# Patient Record
Sex: Female | Born: 1950 | Race: White | Hispanic: No | Marital: Married
Health system: Southern US, Community
[De-identification: ages and names within clinical notes are randomized; demographics above are authoritative.]

---

## 2002-02-01 ENCOUNTER — Other Ambulatory Visit: Admission: RE | Admit: 2002-02-01 | Discharge: 2002-02-01 | Payer: Self-pay | Admitting: Obstetrics and Gynecology

## 2002-02-20 ENCOUNTER — Encounter: Payer: Self-pay | Admitting: Obstetrics and Gynecology

## 2002-02-20 ENCOUNTER — Ambulatory Visit (HOSPITAL_COMMUNITY): Admission: RE | Admit: 2002-02-20 | Discharge: 2002-02-20 | Payer: Self-pay | Admitting: Obstetrics and Gynecology

## 2003-02-22 ENCOUNTER — Ambulatory Visit (HOSPITAL_COMMUNITY): Admission: RE | Admit: 2003-02-22 | Discharge: 2003-02-22 | Payer: Self-pay | Admitting: Family Medicine

## 2003-03-13 ENCOUNTER — Encounter: Admission: RE | Admit: 2003-03-13 | Discharge: 2003-03-13 | Payer: Self-pay | Admitting: Family Medicine

## 2004-02-28 ENCOUNTER — Ambulatory Visit (HOSPITAL_COMMUNITY): Admission: RE | Admit: 2004-02-28 | Discharge: 2004-02-28 | Payer: Self-pay | Admitting: Family Medicine

## 2005-03-15 ENCOUNTER — Ambulatory Visit (HOSPITAL_COMMUNITY): Admission: RE | Admit: 2005-03-15 | Discharge: 2005-03-15 | Payer: Self-pay | Admitting: Family Medicine

## 2006-03-24 ENCOUNTER — Ambulatory Visit (HOSPITAL_COMMUNITY): Admission: RE | Admit: 2006-03-24 | Discharge: 2006-03-24 | Payer: Self-pay | Admitting: Family Medicine

## 2006-04-08 ENCOUNTER — Encounter: Admission: RE | Admit: 2006-04-08 | Discharge: 2006-04-08 | Payer: Self-pay | Admitting: Family Medicine

## 2007-04-19 ENCOUNTER — Ambulatory Visit (HOSPITAL_COMMUNITY): Admission: RE | Admit: 2007-04-19 | Discharge: 2007-04-19 | Payer: Self-pay | Admitting: Family Medicine

## 2008-04-19 ENCOUNTER — Ambulatory Visit (HOSPITAL_COMMUNITY): Admission: RE | Admit: 2008-04-19 | Discharge: 2008-04-19 | Payer: Self-pay | Admitting: Family Medicine

## 2008-04-22 ENCOUNTER — Ambulatory Visit (HOSPITAL_COMMUNITY): Admission: RE | Admit: 2008-04-22 | Discharge: 2008-04-22 | Payer: Self-pay | Admitting: Family Medicine

## 2009-04-21 ENCOUNTER — Ambulatory Visit (HOSPITAL_COMMUNITY): Admission: RE | Admit: 2009-04-21 | Discharge: 2009-04-21 | Payer: Self-pay | Admitting: Family Medicine

## 2009-04-21 DIAGNOSIS — J309 Allergic rhinitis, unspecified: Secondary | ICD-10-CM | POA: Insufficient documentation

## 2010-03-08 ENCOUNTER — Encounter: Payer: Self-pay | Admitting: Neurosurgery

## 2010-03-08 ENCOUNTER — Encounter: Payer: Self-pay | Admitting: Family Medicine

## 2010-03-26 ENCOUNTER — Other Ambulatory Visit (HOSPITAL_COMMUNITY): Payer: Self-pay | Admitting: Family Medicine

## 2010-03-26 DIAGNOSIS — Z1231 Encounter for screening mammogram for malignant neoplasm of breast: Secondary | ICD-10-CM

## 2010-04-29 ENCOUNTER — Other Ambulatory Visit (HOSPITAL_COMMUNITY): Payer: Self-pay | Admitting: Family Medicine

## 2010-04-29 ENCOUNTER — Ambulatory Visit (HOSPITAL_COMMUNITY)
Admission: RE | Admit: 2010-04-29 | Discharge: 2010-04-29 | Disposition: A | Discharge status: Home / Self Care | Payer: 59 | Source: Ambulatory Visit | Attending: Family Medicine | Admitting: Family Medicine

## 2010-04-29 DIAGNOSIS — Z1231 Encounter for screening mammogram for malignant neoplasm of breast: Secondary | ICD-10-CM | POA: Insufficient documentation

## 2010-04-29 DIAGNOSIS — Q782 Osteopetrosis: Secondary | ICD-10-CM

## 2010-04-30 DIAGNOSIS — M8580 Other specified disorders of bone density and structure, unspecified site: Secondary | ICD-10-CM | POA: Insufficient documentation

## 2010-05-04 ENCOUNTER — Ambulatory Visit (HOSPITAL_COMMUNITY): Admission: RE | Admit: 2010-05-04 | Payer: 59 | Source: Ambulatory Visit

## 2010-05-05 ENCOUNTER — Ambulatory Visit (HOSPITAL_COMMUNITY)
Admission: RE | Admit: 2010-05-05 | Discharge: 2010-05-05 | Disposition: A | Discharge status: Home / Self Care | Payer: 59 | Source: Ambulatory Visit | Attending: Family Medicine | Admitting: Family Medicine

## 2010-05-05 DIAGNOSIS — Z13820 Encounter for screening for osteoporosis: Secondary | ICD-10-CM | POA: Insufficient documentation

## 2010-05-05 DIAGNOSIS — Q782 Osteopetrosis: Secondary | ICD-10-CM

## 2010-05-05 DIAGNOSIS — Z780 Asymptomatic menopausal state: Secondary | ICD-10-CM | POA: Insufficient documentation

## 2010-05-05 DIAGNOSIS — E559 Vitamin D deficiency, unspecified: Secondary | ICD-10-CM | POA: Insufficient documentation

## 2010-06-04 ENCOUNTER — Ambulatory Visit (HOSPITAL_COMMUNITY): Payer: 59

## 2011-03-30 ENCOUNTER — Other Ambulatory Visit (HOSPITAL_COMMUNITY): Payer: Self-pay | Admitting: Family Medicine

## 2011-03-30 DIAGNOSIS — Z1231 Encounter for screening mammogram for malignant neoplasm of breast: Secondary | ICD-10-CM

## 2011-05-03 ENCOUNTER — Ambulatory Visit (HOSPITAL_COMMUNITY)
Admission: RE | Admit: 2011-05-03 | Discharge: 2011-05-03 | Disposition: A | Discharge status: Home / Self Care | Payer: 59 | Source: Ambulatory Visit | Attending: Family Medicine | Admitting: Family Medicine

## 2011-05-03 DIAGNOSIS — Z1231 Encounter for screening mammogram for malignant neoplasm of breast: Secondary | ICD-10-CM | POA: Insufficient documentation

## 2011-05-27 DIAGNOSIS — G4700 Insomnia, unspecified: Secondary | ICD-10-CM | POA: Insufficient documentation

## 2011-10-22 DIAGNOSIS — K219 Gastro-esophageal reflux disease without esophagitis: Secondary | ICD-10-CM | POA: Insufficient documentation

## 2012-01-21 DIAGNOSIS — F329 Major depressive disorder, single episode, unspecified: Secondary | ICD-10-CM | POA: Insufficient documentation

## 2012-03-03 DIAGNOSIS — F4321 Adjustment disorder with depressed mood: Secondary | ICD-10-CM | POA: Insufficient documentation

## 2012-04-04 ENCOUNTER — Other Ambulatory Visit (HOSPITAL_COMMUNITY): Payer: Self-pay | Admitting: Family Medicine

## 2012-04-04 DIAGNOSIS — Z1231 Encounter for screening mammogram for malignant neoplasm of breast: Secondary | ICD-10-CM

## 2012-05-11 ENCOUNTER — Ambulatory Visit (HOSPITAL_COMMUNITY)
Admission: RE | Admit: 2012-05-11 | Discharge: 2012-05-11 | Disposition: A | Discharge status: Home / Self Care | Payer: BC Managed Care – PPO | Source: Ambulatory Visit | Attending: Family Medicine | Admitting: Family Medicine

## 2012-05-11 DIAGNOSIS — Z1231 Encounter for screening mammogram for malignant neoplasm of breast: Secondary | ICD-10-CM | POA: Insufficient documentation

## 2013-04-09 ENCOUNTER — Other Ambulatory Visit (HOSPITAL_COMMUNITY): Payer: Self-pay | Admitting: Family Medicine

## 2013-04-09 DIAGNOSIS — Z1231 Encounter for screening mammogram for malignant neoplasm of breast: Secondary | ICD-10-CM

## 2013-05-17 ENCOUNTER — Ambulatory Visit (HOSPITAL_COMMUNITY)
Admission: RE | Admit: 2013-05-17 | Discharge: 2013-05-17 | Disposition: A | Discharge status: Home / Self Care | Payer: BC Managed Care – PPO | Source: Ambulatory Visit | Attending: Family Medicine | Admitting: Family Medicine

## 2013-05-17 DIAGNOSIS — Z1231 Encounter for screening mammogram for malignant neoplasm of breast: Secondary | ICD-10-CM

## 2013-12-05 ENCOUNTER — Other Ambulatory Visit (HOSPITAL_COMMUNITY): Payer: Self-pay | Admitting: Family Medicine

## 2013-12-05 DIAGNOSIS — M810 Age-related osteoporosis without current pathological fracture: Secondary | ICD-10-CM

## 2013-12-06 ENCOUNTER — Ambulatory Visit (HOSPITAL_COMMUNITY)
Admission: RE | Admit: 2013-12-06 | Discharge: 2013-12-06 | Disposition: A | Discharge status: Home / Self Care | Payer: BC Managed Care – PPO | Source: Ambulatory Visit | Attending: Family Medicine | Admitting: Family Medicine

## 2013-12-06 DIAGNOSIS — Z780 Asymptomatic menopausal state: Secondary | ICD-10-CM | POA: Diagnosis not present

## 2013-12-06 DIAGNOSIS — Z13820 Encounter for screening for osteoporosis: Secondary | ICD-10-CM | POA: Insufficient documentation

## 2013-12-06 DIAGNOSIS — M810 Age-related osteoporosis without current pathological fracture: Secondary | ICD-10-CM

## 2014-05-07 ENCOUNTER — Other Ambulatory Visit (HOSPITAL_COMMUNITY): Payer: Self-pay | Admitting: Family Medicine

## 2014-05-07 DIAGNOSIS — Z1231 Encounter for screening mammogram for malignant neoplasm of breast: Secondary | ICD-10-CM

## 2014-05-22 ENCOUNTER — Ambulatory Visit (HOSPITAL_COMMUNITY)
Admission: RE | Admit: 2014-05-22 | Discharge: 2014-05-22 | Disposition: A | Discharge status: Home / Self Care | Payer: Medicare Other | Source: Ambulatory Visit | Attending: Family Medicine | Admitting: Family Medicine

## 2014-05-22 DIAGNOSIS — Z1231 Encounter for screening mammogram for malignant neoplasm of breast: Secondary | ICD-10-CM

## 2015-04-14 ENCOUNTER — Other Ambulatory Visit: Payer: Self-pay

## 2015-04-14 DIAGNOSIS — Z1231 Encounter for screening mammogram for malignant neoplasm of breast: Secondary | ICD-10-CM

## 2015-05-23 ENCOUNTER — Ambulatory Visit
Admission: RE | Admit: 2015-05-23 | Discharge: 2015-05-23 | Disposition: A | Discharge status: Home / Self Care | Payer: Medicare Other | Source: Ambulatory Visit

## 2015-05-23 DIAGNOSIS — Z1231 Encounter for screening mammogram for malignant neoplasm of breast: Secondary | ICD-10-CM

## 2015-12-29 ENCOUNTER — Other Ambulatory Visit: Payer: Self-pay | Admitting: Family Medicine

## 2015-12-29 DIAGNOSIS — M810 Age-related osteoporosis without current pathological fracture: Secondary | ICD-10-CM

## 2016-01-05 ENCOUNTER — Other Ambulatory Visit: Payer: Medicare Other

## 2016-01-05 ENCOUNTER — Ambulatory Visit
Admission: RE | Admit: 2016-01-05 | Discharge: 2016-01-05 | Disposition: A | Discharge status: Home / Self Care | Payer: Medicare Other | Source: Ambulatory Visit | Attending: Family Medicine | Admitting: Family Medicine

## 2016-01-05 DIAGNOSIS — M810 Age-related osteoporosis without current pathological fracture: Secondary | ICD-10-CM

## 2016-04-12 ENCOUNTER — Other Ambulatory Visit: Payer: Self-pay | Admitting: Family Medicine

## 2016-04-12 DIAGNOSIS — Z1231 Encounter for screening mammogram for malignant neoplasm of breast: Secondary | ICD-10-CM

## 2016-06-09 ENCOUNTER — Ambulatory Visit
Admission: RE | Admit: 2016-06-09 | Discharge: 2016-06-09 | Disposition: A | Discharge status: Home / Self Care | Payer: Medicare Other | Source: Ambulatory Visit | Attending: Family Medicine | Admitting: Family Medicine

## 2016-06-09 DIAGNOSIS — Z1231 Encounter for screening mammogram for malignant neoplasm of breast: Secondary | ICD-10-CM

## 2017-05-02 ENCOUNTER — Other Ambulatory Visit: Payer: Self-pay | Admitting: Family Medicine

## 2017-05-02 DIAGNOSIS — Z1231 Encounter for screening mammogram for malignant neoplasm of breast: Secondary | ICD-10-CM

## 2017-06-15 ENCOUNTER — Ambulatory Visit
Admission: RE | Admit: 2017-06-15 | Discharge: 2017-06-15 | Disposition: A | Discharge status: Home / Self Care | Payer: Medicare Other | Source: Ambulatory Visit | Attending: Family Medicine | Admitting: Family Medicine

## 2017-06-15 DIAGNOSIS — Z1231 Encounter for screening mammogram for malignant neoplasm of breast: Secondary | ICD-10-CM

## 2018-02-11 DIAGNOSIS — K353 Acute appendicitis with localized peritonitis, without perforation or gangrene: Secondary | ICD-10-CM | POA: Insufficient documentation

## 2018-02-11 DIAGNOSIS — K3580 Unspecified acute appendicitis: Secondary | ICD-10-CM | POA: Insufficient documentation

## 2018-02-11 DIAGNOSIS — R1031 Right lower quadrant pain: Secondary | ICD-10-CM | POA: Insufficient documentation

## 2019-01-24 ENCOUNTER — Other Ambulatory Visit: Payer: Self-pay | Admitting: Family Medicine

## 2019-01-24 DIAGNOSIS — E2839 Other primary ovarian failure: Secondary | ICD-10-CM

## 2019-01-24 DIAGNOSIS — Z1231 Encounter for screening mammogram for malignant neoplasm of breast: Secondary | ICD-10-CM

## 2019-01-24 DIAGNOSIS — E559 Vitamin D deficiency, unspecified: Secondary | ICD-10-CM

## 2019-01-24 DIAGNOSIS — M810 Age-related osteoporosis without current pathological fracture: Secondary | ICD-10-CM

## 2019-06-21 ENCOUNTER — Other Ambulatory Visit: Payer: Medicare Other

## 2019-06-21 ENCOUNTER — Other Ambulatory Visit: Payer: Self-pay

## 2019-06-21 ENCOUNTER — Ambulatory Visit: Payer: Medicare Other

## 2019-06-21 ENCOUNTER — Ambulatory Visit
Admission: RE | Admit: 2019-06-21 | Discharge: 2019-06-21 | Disposition: A | Discharge status: Home / Self Care | Payer: Medicare Other | Source: Ambulatory Visit | Attending: Family Medicine | Admitting: Family Medicine

## 2019-06-21 DIAGNOSIS — E2839 Other primary ovarian failure: Secondary | ICD-10-CM

## 2019-06-21 DIAGNOSIS — E559 Vitamin D deficiency, unspecified: Secondary | ICD-10-CM

## 2019-06-21 DIAGNOSIS — Z1231 Encounter for screening mammogram for malignant neoplasm of breast: Secondary | ICD-10-CM

## 2019-06-21 DIAGNOSIS — M810 Age-related osteoporosis without current pathological fracture: Secondary | ICD-10-CM

## 2019-08-16 DIAGNOSIS — E7800 Pure hypercholesterolemia, unspecified: Secondary | ICD-10-CM | POA: Insufficient documentation

## 2019-08-16 DIAGNOSIS — Z789 Other specified health status: Secondary | ICD-10-CM | POA: Insufficient documentation

## 2019-08-17 DIAGNOSIS — M25512 Pain in left shoulder: Secondary | ICD-10-CM | POA: Insufficient documentation

## 2020-06-02 ENCOUNTER — Other Ambulatory Visit: Payer: Self-pay | Admitting: Family Medicine

## 2020-06-02 DIAGNOSIS — Z1231 Encounter for screening mammogram for malignant neoplasm of breast: Secondary | ICD-10-CM

## 2020-07-16 ENCOUNTER — Other Ambulatory Visit: Payer: Self-pay

## 2020-07-16 ENCOUNTER — Ambulatory Visit
Admission: RE | Admit: 2020-07-16 | Discharge: 2020-07-16 | Disposition: A | Discharge status: Home / Self Care | Payer: Medicare Other | Source: Ambulatory Visit | Attending: Family Medicine | Admitting: Family Medicine

## 2020-07-16 DIAGNOSIS — Z1231 Encounter for screening mammogram for malignant neoplasm of breast: Secondary | ICD-10-CM

## 2021-01-22 DIAGNOSIS — W19XXXA Unspecified fall, initial encounter: Secondary | ICD-10-CM | POA: Insufficient documentation

## 2021-01-23 DIAGNOSIS — S52021A Displaced fracture of olecranon process without intraarticular extension of right ulna, initial encounter for closed fracture: Secondary | ICD-10-CM | POA: Insufficient documentation

## 2021-03-17 ENCOUNTER — Other Ambulatory Visit: Payer: Self-pay | Admitting: Family Medicine

## 2021-03-17 DIAGNOSIS — N6310 Unspecified lump in the right breast, unspecified quadrant: Secondary | ICD-10-CM

## 2021-04-10 ENCOUNTER — Other Ambulatory Visit: Payer: Self-pay

## 2021-04-10 ENCOUNTER — Ambulatory Visit
Admission: RE | Admit: 2021-04-10 | Discharge: 2021-04-10 | Disposition: A | Discharge status: Home / Self Care | Payer: Medicare Other | Source: Ambulatory Visit | Attending: Family Medicine | Admitting: Family Medicine

## 2021-04-10 ENCOUNTER — Other Ambulatory Visit: Payer: Medicare Other

## 2021-04-10 DIAGNOSIS — N6310 Unspecified lump in the right breast, unspecified quadrant: Secondary | ICD-10-CM

## 2021-06-10 ENCOUNTER — Other Ambulatory Visit: Payer: Self-pay | Admitting: Family Medicine

## 2021-06-10 DIAGNOSIS — M810 Age-related osteoporosis without current pathological fracture: Secondary | ICD-10-CM

## 2021-06-12 ENCOUNTER — Other Ambulatory Visit: Payer: Self-pay | Admitting: Family Medicine

## 2021-06-12 DIAGNOSIS — Z1231 Encounter for screening mammogram for malignant neoplasm of breast: Secondary | ICD-10-CM

## 2021-07-17 ENCOUNTER — Ambulatory Visit
Admission: RE | Admit: 2021-07-17 | Discharge: 2021-07-17 | Disposition: A | Discharge status: Home / Self Care | Payer: Medicare Other | Source: Ambulatory Visit | Attending: Family Medicine | Admitting: Family Medicine

## 2021-07-17 DIAGNOSIS — Z1231 Encounter for screening mammogram for malignant neoplasm of breast: Secondary | ICD-10-CM

## 2021-11-25 ENCOUNTER — Ambulatory Visit
Admission: RE | Admit: 2021-11-25 | Discharge: 2021-11-25 | Disposition: A | Discharge status: Home / Self Care | Payer: Medicare Other | Source: Ambulatory Visit | Attending: Family Medicine | Admitting: Family Medicine

## 2021-11-25 DIAGNOSIS — M810 Age-related osteoporosis without current pathological fracture: Secondary | ICD-10-CM

## 2022-06-15 ENCOUNTER — Other Ambulatory Visit: Payer: Self-pay | Admitting: Family Medicine

## 2022-06-15 DIAGNOSIS — Z1231 Encounter for screening mammogram for malignant neoplasm of breast: Secondary | ICD-10-CM

## 2022-06-18 ENCOUNTER — Ambulatory Visit: Payer: Medicare Other | Admitting: Podiatry

## 2022-06-18 ENCOUNTER — Encounter: Payer: Self-pay | Admitting: Podiatry

## 2022-06-18 ENCOUNTER — Ambulatory Visit (INDEPENDENT_AMBULATORY_CARE_PROVIDER_SITE_OTHER): Payer: Medicare Other

## 2022-06-18 DIAGNOSIS — M7661 Achilles tendinitis, right leg: Secondary | ICD-10-CM

## 2022-06-18 DIAGNOSIS — M722 Plantar fascial fibromatosis: Secondary | ICD-10-CM | POA: Diagnosis not present

## 2022-06-18 MED ORDER — TRIAMCINOLONE ACETONIDE 10 MG/ML IJ SUSP
10.0000 mg | Freq: Once | INTRAMUSCULAR | Status: AC
  Administered 2022-06-18: 10 mg

## 2022-06-18 NOTE — Patient Instructions (Signed)

## 2022-06-20 NOTE — Progress Notes (Signed)
Subjective:   Patient ID: Gabrielle Bennett, female   DOB: 72 y.o.   MRN: [id number]   HPI Patient presents stating she has had a lot of pain in her right heel in the back for at least 6 months.  She did have a bad fall in 2022 and states that it did not occur right away but gradually over time.  Patient states that it gets very sore and it is affecting her ability to be active and patient does not smoke   Review of Systems  All other systems reviewed and are negative.       Objective:  Physical Exam Vitals and nursing note reviewed.  Constitutional:      Appearance: She is well-developed.  Pulmonary:     Effort: Pulmonary effort is normal.  Musculoskeletal:        General: Normal range of motion.  Skin:    General: Skin is warm.  Neurological:     Mental Status: She is alert.     Neurovascular status intact muscle strength found to be adequate range of motion adequate with patient found to have quite a bit of inflammation posterior heel lateral side with fluid buildup with minimal discomfort at the central and medial side of the insertion of the Achilles tendon right.  Good digital perfusion well-oriented x 3     Assessment:  Acute Achilles tendinitis right with inflammation at the insertion of the tendon calcaneus lateral side     Plan:  H&P x-ray reviewed went ahead today did sterile prep and injected the lateral side after explaining risk of rupture with 3 mg Dexasone Kenalog 5 mg Xylocaine keep it away from the central medial side.  Applied air fracture walker which was fitted properly to her lower leg to immobilize her due to the discomfort she is experiencing and this was fitted well.  Reappoint 3 weeks to recheck  X-rays indicate spur formation plantar aspect and posterior aspect right heel with no signs of arthritis stress fracture

## 2022-07-16 ENCOUNTER — Ambulatory Visit: Payer: Medicare Other | Admitting: Podiatry

## 2022-07-16 ENCOUNTER — Encounter: Payer: Self-pay | Admitting: Podiatry

## 2022-07-16 DIAGNOSIS — M7661 Achilles tendinitis, right leg: Secondary | ICD-10-CM

## 2022-07-16 DIAGNOSIS — Z1211 Encounter for screening for malignant neoplasm of colon: Secondary | ICD-10-CM | POA: Insufficient documentation

## 2022-07-16 DIAGNOSIS — K5730 Diverticulosis of large intestine without perforation or abscess without bleeding: Secondary | ICD-10-CM | POA: Insufficient documentation

## 2022-07-16 NOTE — Progress Notes (Signed)
Subjective:   Patient ID: Gabrielle Bennett, female   DOB: 72 y.o.   MRN: [id number]   HPI Patient presents stating the pain is mostly gone I still get some light achiness in the back of the heel   ROS      Objective:  Physical Exam  Neuro ocular status intact significant diminishment of discomfort posterior heel region right no indication of tendon dysfunction or loss of strength with patient still wearing the boot the vast majority of the time     Assessment:  Achilles tendinitis right improving     Plan:  Reviewed condition recommended continuation of boot for 2 more weeks heel lift stretching and diclofenac gel.  Patient is discharged will be seen back as symptoms indicate

## 2022-07-20 ENCOUNTER — Ambulatory Visit
Admission: RE | Admit: 2022-07-20 | Discharge: 2022-07-20 | Disposition: A | Discharge status: Home / Self Care | Payer: Medicare Other | Source: Ambulatory Visit | Attending: Family Medicine | Admitting: Family Medicine

## 2022-07-20 DIAGNOSIS — Z1231 Encounter for screening mammogram for malignant neoplasm of breast: Secondary | ICD-10-CM

## 2023-04-05 ENCOUNTER — Other Ambulatory Visit: Payer: Self-pay | Admitting: Family Medicine

## 2023-04-05 DIAGNOSIS — M8589 Other specified disorders of bone density and structure, multiple sites: Secondary | ICD-10-CM

## 2023-06-21 ENCOUNTER — Other Ambulatory Visit: Payer: Self-pay | Admitting: Family Medicine

## 2023-06-21 DIAGNOSIS — Z1231 Encounter for screening mammogram for malignant neoplasm of breast: Secondary | ICD-10-CM

## 2023-07-21 ENCOUNTER — Ambulatory Visit

## 2023-07-21 ENCOUNTER — Ambulatory Visit
Admission: RE | Admit: 2023-07-21 | Discharge: 2023-07-21 | Disposition: A | Discharge status: Home / Self Care | Source: Ambulatory Visit | Attending: Family Medicine | Admitting: Family Medicine

## 2023-07-21 DIAGNOSIS — Z1231 Encounter for screening mammogram for malignant neoplasm of breast: Secondary | ICD-10-CM

## 2023-10-02 IMAGING — MG MM DIGITAL SCREENING BILAT W/ TOMO AND CAD
8 series · 8 of 24 positions shown · non-contrast
Comparison: Previous exam(s).

CLINICAL DATA: Screening.

EXAM:
DIGITAL SCREENING BILATERAL MAMMOGRAM WITH TOMOSYNTHESIS AND CAD
TECHNIQUE: Bilateral screening digital craniocaudal and mediolateral oblique
mammograms were obtained. Bilateral screening digital breast
tomosynthesis was performed. The images were evaluated with
computer-aided detection.

[R MLO synth-2D]
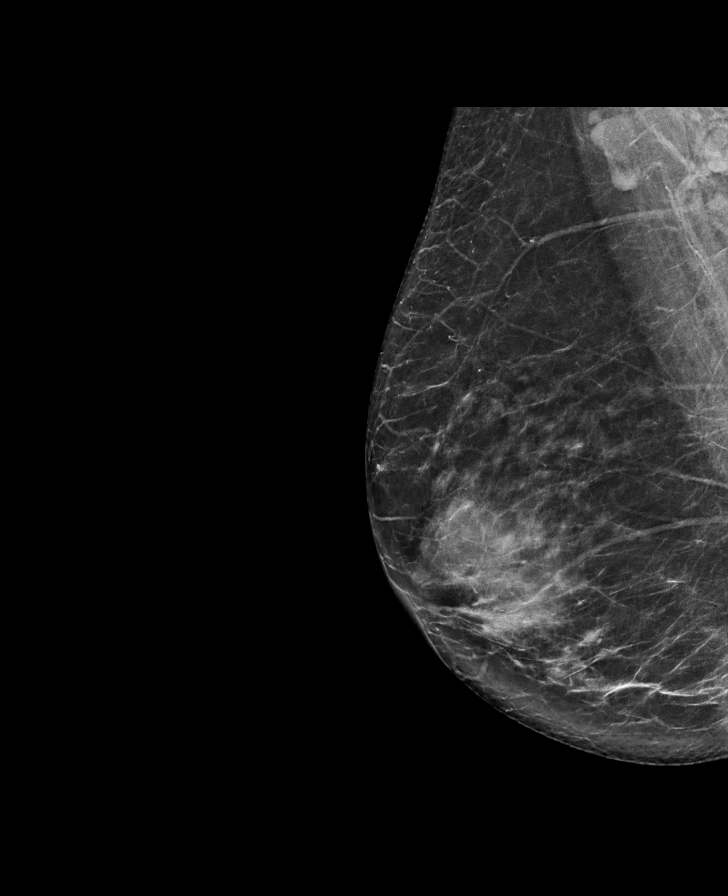

[L CC synth-2D]
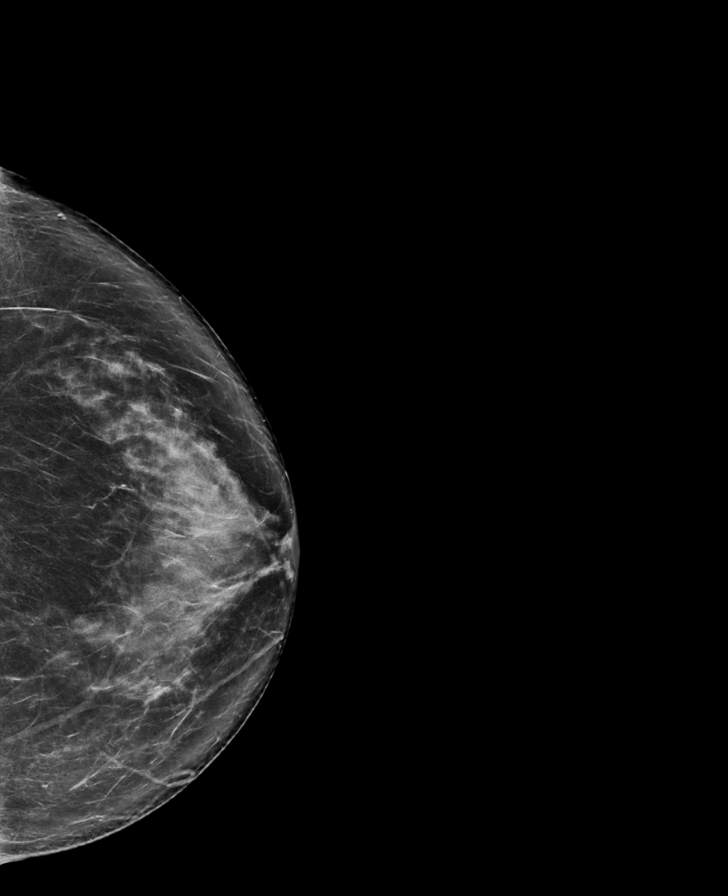

[L MLO synth-2D]
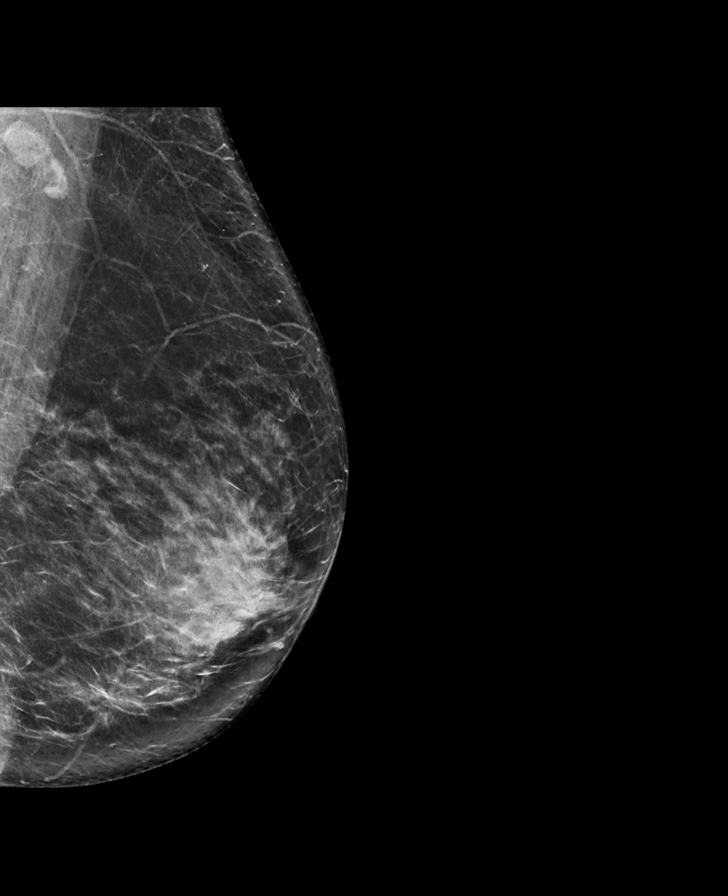

[R CC synth-2D]
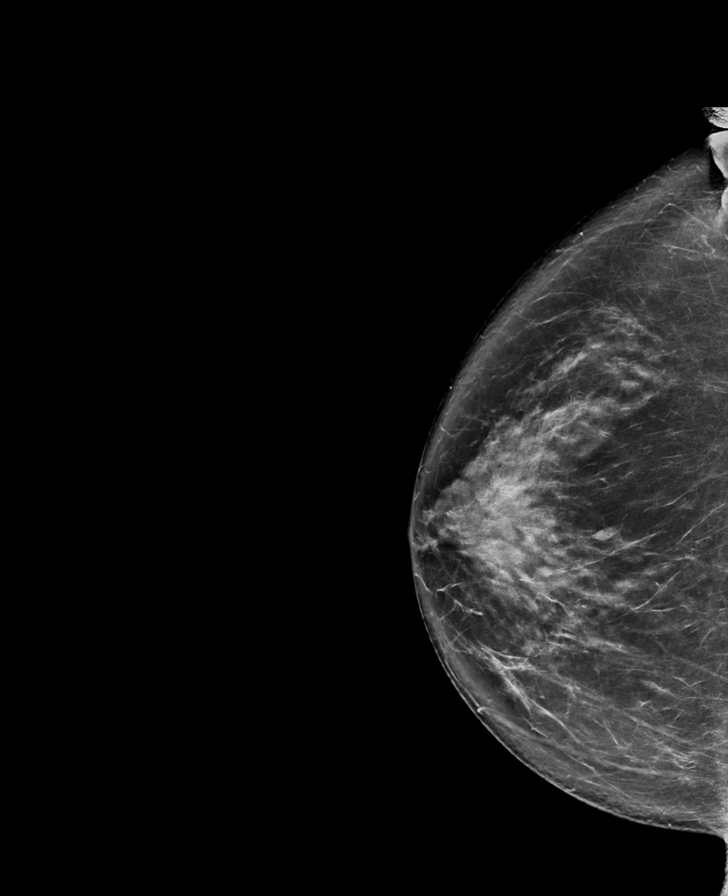

[R CC tomo · tomo slice 47/92.0]
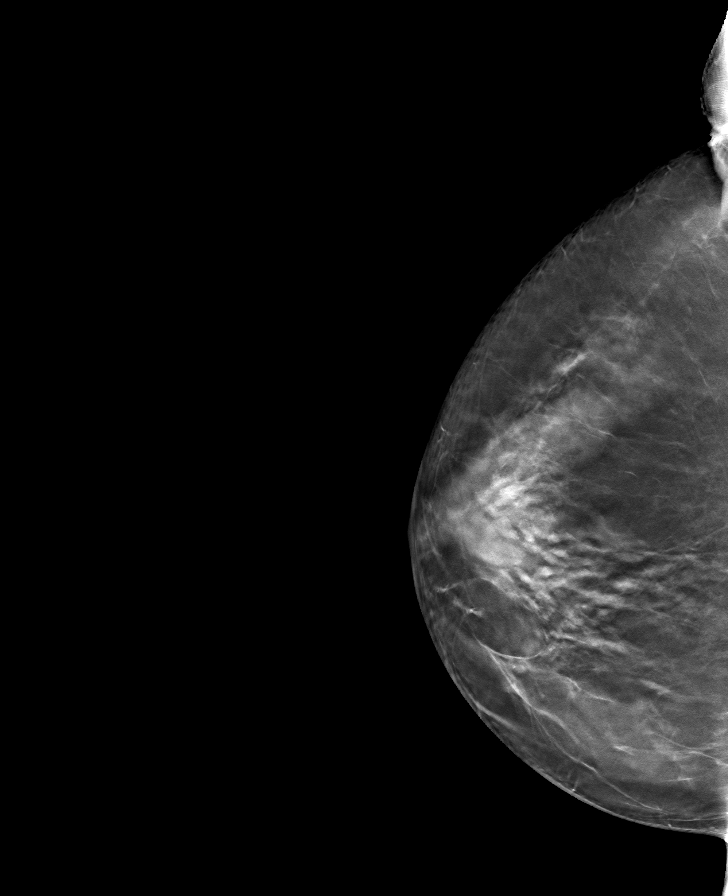

[L CC tomo · tomo slice 45/90.0]
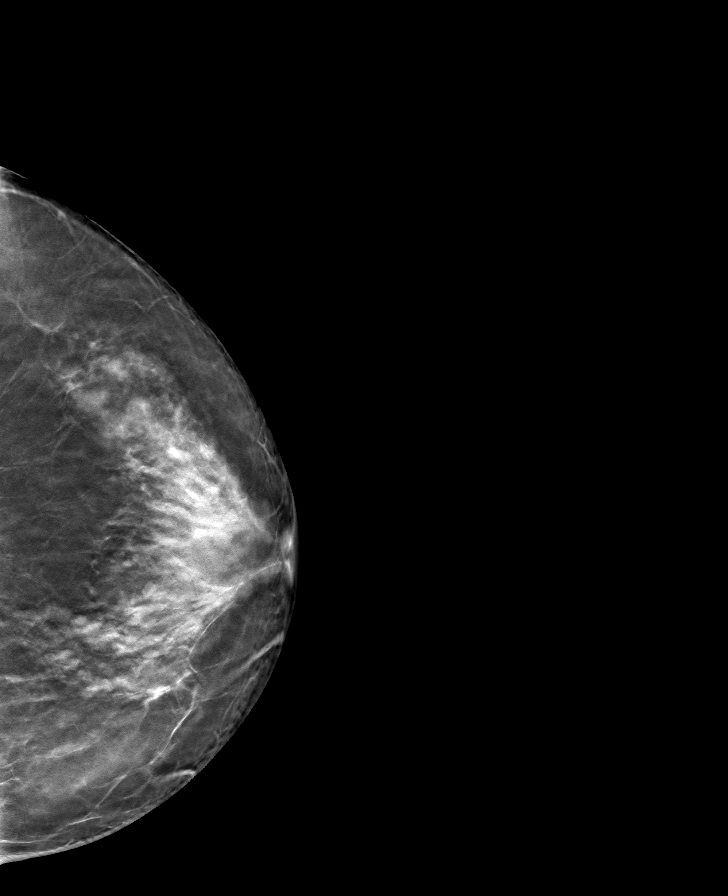

[R MLO tomo · tomo slice 43/84.0]
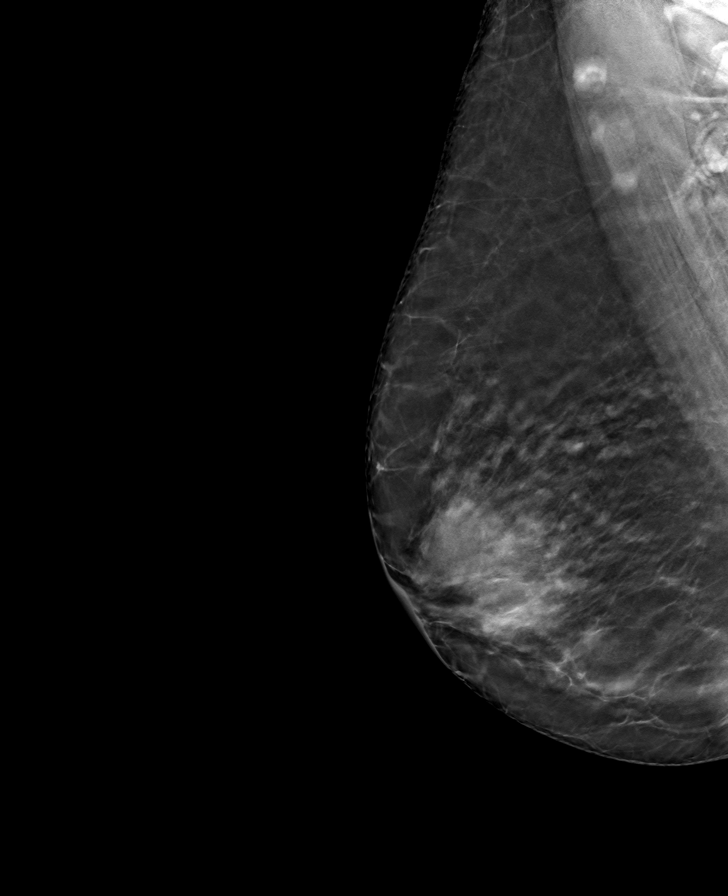

[L MLO tomo · tomo slice 41/82.0]
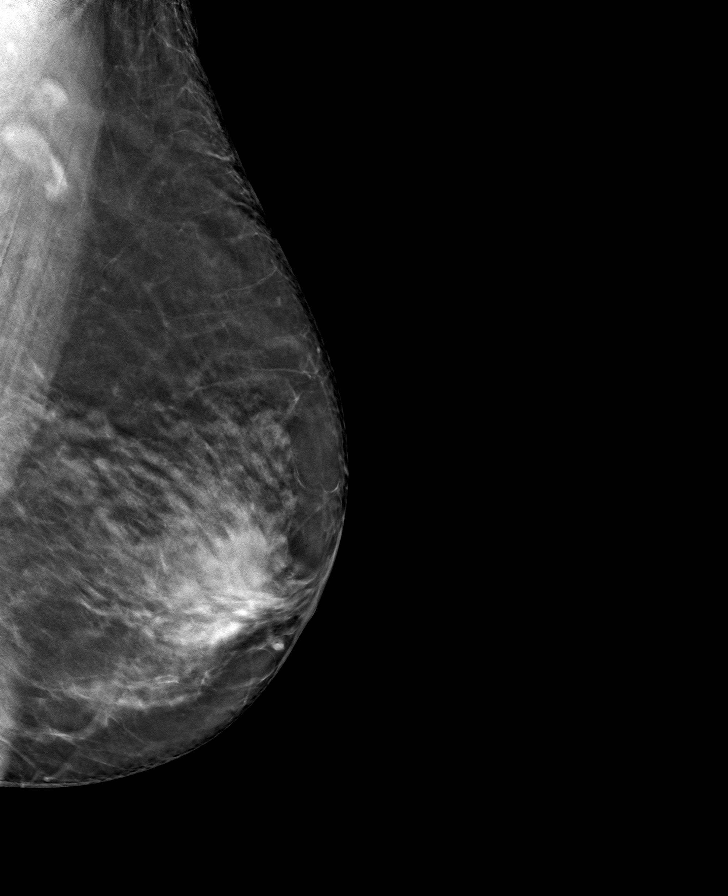

[8 of 24 positions shown; findings below may reference images not displayed]

ACR Breast Density Category c: The breast tissue is heterogeneously
dense, which may obscure small masses.
FINDINGS: There are no findings suspicious for malignancy.
IMPRESSION: No mammographic evidence of malignancy. A result letter of this
screening mammogram will be mailed directly to the patient.

RECOMMENDATION:
Screening mammogram in one year. (Code:Q3-W-BC3)

BI-RADS CATEGORY  1: Negative.

## 2023-11-30 ENCOUNTER — Ambulatory Visit

## 2023-11-30 ENCOUNTER — Other Ambulatory Visit: Payer: Medicare Other

## 2023-11-30 DIAGNOSIS — M8580 Other specified disorders of bone density and structure, unspecified site: Secondary | ICD-10-CM | POA: Diagnosis not present

## 2023-11-30 DIAGNOSIS — M8589 Other specified disorders of bone density and structure, multiple sites: Secondary | ICD-10-CM
